# Patient Record
Sex: Male | Born: 1975 | Race: Black or African American | Hispanic: No | Marital: Married | Smoking: Never smoker
Health system: Southern US, Community
[De-identification: ages and names within clinical notes are randomized; demographics above are authoritative.]

---

## 2011-01-31 ENCOUNTER — Emergency Department (HOSPITAL_COMMUNITY): Payer: BC Managed Care – PPO

## 2011-01-31 ENCOUNTER — Emergency Department (HOSPITAL_COMMUNITY)
Admission: EM | Admit: 2011-01-31 | Discharge: 2011-01-31 | Disposition: A | Discharge status: Home / Self Care | Payer: BC Managed Care – PPO | Attending: Emergency Medicine | Admitting: Emergency Medicine

## 2011-01-31 DIAGNOSIS — R6884 Jaw pain: Secondary | ICD-10-CM | POA: Insufficient documentation

## 2011-01-31 DIAGNOSIS — K089 Disorder of teeth and supporting structures, unspecified: Secondary | ICD-10-CM | POA: Insufficient documentation

## 2011-01-31 DIAGNOSIS — K029 Dental caries, unspecified: Secondary | ICD-10-CM | POA: Insufficient documentation

## 2011-01-31 DIAGNOSIS — K047 Periapical abscess without sinus: Secondary | ICD-10-CM

## 2011-01-31 MED ORDER — AMOXICILLIN 250 MG PO CAPS: 250.0000 mg | ORAL_CAPSULE | Freq: Two times a day (BID) | ORAL | Status: AC

## 2011-01-31 MED ORDER — OXYCODONE-ACETAMINOPHEN 5-325 MG PO TABS
2.0000 | ORAL_TABLET | Freq: Once | ORAL | Status: AC
  Administered 2011-01-31: 2 via ORAL
  Filled 2011-01-31: qty 2

## 2011-01-31 MED ORDER — OXYCODONE-ACETAMINOPHEN 5-325 MG PO TABS: 1.0000 | ORAL_TABLET | Freq: Four times a day (QID) | ORAL | Status: AC | PRN

## 2011-01-31 NOTE — ED Notes (Signed)
Pt presents with NAD- seen by dentist today for presenting complaint and was referred here for evaluation

## 2011-01-31 NOTE — ED Notes (Signed)
Pt given discharge info and stated understanding amb indep to discharge window with spouse.

## 2011-01-31 NOTE — ED Provider Notes (Signed)
History     CSN: [id number] Arrival date & time: 01/31/2011 10:34 AM   First MD Initiated Contact with Patient 01/31/11 1217      Chief Complaint  Patient presents with  . Oral Swelling  . Dental Pain  . Facial Swelling    (Consider location/radiation/quality/duration/timing/severity/associated sxs/prior treatment) The history is provided by the patient.    Pt presents to the ED from his dentist for a dental abscess. He has been referred to an oral surgeon, however, the dentist wanted to be sure that the patient did not have periosteal involvement. Pt has been having pain and swelling for approx a week. He has multiple cavitates and the lesion in question is involved with a broken tooth. Pt has been referred to [facility]. He denies severe pain, fevers, chills, weakness, previous injury, previous abscesses, and headaches.  History reviewed. No pertinent past medical history.  History reviewed. No pertinent past surgical history.  History reviewed. No pertinent family history.  History  Substance Use Topics  . Smoking status: Never Smoker   . Smokeless tobacco: Not on file  . Alcohol Use: Yes      Review of Systems  All other systems reviewed and are negative.    Allergies  Review of patient's allergies indicates no known allergies.  Home Medications   Current Outpatient Rx  Name Route Sig Dispense Refill  . IBUPROFEN 200 MG PO TABS Oral Take 200 mg by mouth every 6 (six) hours as needed. pain       BP 139/83  Pulse 77  Temp(Src) 98.4 F (36.9 C) (Oral)  Resp 18  Wt 235 lb (106.595 kg)  SpO2 100%  Physical Exam  Nursing note and vitals reviewed. Constitutional: He is oriented to person, place, and time. He appears well-developed and well-nourished.  HENT:  Head: Normocephalic and atraumatic. No trismus in the jaw.  Mouth/Throat: He does not have dentures. No oral lesions. Normal dentition. Dental abscesses and dental caries present. No uvula  swelling or lacerations.    Eyes: EOM are normal. Pupils are equal, round, and reactive to light.  Neck: Normal range of motion.  Cardiovascular: Normal rate and regular rhythm.   Pulmonary/Chest: Effort normal and breath sounds normal.  Musculoskeletal: Normal range of motion.  Neurological: He is alert and oriented to person, place, and time.  Skin: Skin is warm and dry.    ED Course  Procedures (including critical care time)  Labs Reviewed - No data to display Ct Maxillofacial Wo Cm  01/31/2011  *RADIOLOGY REPORT*  Clinical Data: Left lower tooth and jaw pain over the past 3 days. Left sided facial swelling.  CT MAXILLOFACIAL WITHOUT CONTRAST 01/31/2011:  Technique:  Multidetector CT imaging of the maxillofacial structures was performed. Multiplanar CT image reconstructions were also generated.   A metallic BB was placed on the right frontotemporal region in order to reliably differentiate right from left.  Comparison: None.  Findings: Large dental caries involving the first left mandibular molar and the second and third right mandibular molars (teeth numbers 19, 31, 32) without evidence of significant periapical abscesses.  Localized soft tissue swelling overlying the left mandible; without intravenous access, it is difficult to determine whether this is a true abscess or whether it is a phlegmon.  No evidence of subperiosteal abscess on the bone window images.  Edema in the subcutaneous fat of the lower left face.  Orbits and globes intact. Paranasal sinuses, mastoid air cells, and middle ear cavities well-aerated.  Bony  nasal septal deviation to the right.  IMPRESSION:  1.  Large dental caries involving the left first mandibular molar and the right second and third mandibular molars (teeth numbers 19, 31, 32). 2.  Localized soft tissue swelling overlying the left mandible; without intravenous contrast, is difficult to know if this represents a true abscess or just phlegmonous change.  Original  Report Authenticated By: Arnell Sieving, M.D.     No diagnosis found.    MDM  Spoke with radiologist doctor Lyman Bishop regarding  CT scan. Scan with contrast preferred to differentiate between phlegmon and abscess. However, infection is localized with no signs of periosteal involvement.         Dorthula Matas, PA 01/31/11 1426

## 2011-01-31 NOTE — ED Provider Notes (Signed)
Medical screening examination/treatment/procedure(s) were performed by non-physician practitioner and as supervising physician I was immediately available for consultation/collaboration.    Nelia Shi, MD 01/31/11 [phone]

## 2013-09-27 ENCOUNTER — Emergency Department (HOSPITAL_COMMUNITY)
Admission: EM | Admit: 2013-09-27 | Discharge: 2013-09-27 | Disposition: A | Discharge status: Home / Self Care | Payer: BC Managed Care – PPO | Attending: Emergency Medicine | Admitting: Emergency Medicine

## 2013-09-27 ENCOUNTER — Emergency Department (HOSPITAL_COMMUNITY): Payer: BC Managed Care – PPO

## 2013-09-27 ENCOUNTER — Encounter (HOSPITAL_COMMUNITY): Payer: Self-pay | Admitting: Emergency Medicine

## 2013-09-27 DIAGNOSIS — Y92410 Unspecified street and highway as the place of occurrence of the external cause: Secondary | ICD-10-CM | POA: Insufficient documentation

## 2013-09-27 DIAGNOSIS — S40012A Contusion of left shoulder, initial encounter: Secondary | ICD-10-CM

## 2013-09-27 DIAGNOSIS — Z23 Encounter for immunization: Secondary | ICD-10-CM | POA: Insufficient documentation

## 2013-09-27 DIAGNOSIS — Y9355 Activity, bike riding: Secondary | ICD-10-CM | POA: Insufficient documentation

## 2013-09-27 DIAGNOSIS — S40019A Contusion of unspecified shoulder, initial encounter: Secondary | ICD-10-CM | POA: Insufficient documentation

## 2013-09-27 DIAGNOSIS — IMO0002 Reserved for concepts with insufficient information to code with codable children: Secondary | ICD-10-CM | POA: Insufficient documentation

## 2013-09-27 LAB — COMPREHENSIVE METABOLIC PANEL
ALBUMIN: 4.4 g/dL (ref 3.5–5.2)
ALK PHOS: 79 U/L (ref 39–117)
ALT: 40 U/L (ref 0–53)
AST: 54 U/L — AB (ref 0–37)
Anion gap: 15 (ref 5–15)
BILIRUBIN TOTAL: 0.3 mg/dL (ref 0.3–1.2)
BUN: 21 mg/dL (ref 6–23)
CHLORIDE: 108 meq/L (ref 96–112)
CO2: 23 mEq/L (ref 19–32)
Calcium: 9.1 mg/dL (ref 8.4–10.5)
Creatinine, Ser: 0.94 mg/dL (ref 0.50–1.35)
GFR calc Af Amer: >90 mL/min (ref >90)
GFR calc non Af Amer: >90 mL/min (ref >90)
Glucose, Bld: 149 mg/dL — ABNORMAL HIGH (ref 70–99)
POTASSIUM: 3.5 meq/L — AB (ref 3.7–5.3)
SODIUM: 146 meq/L (ref 137–147)
TOTAL PROTEIN: 7.8 g/dL (ref 6.0–8.3)

## 2013-09-27 LAB — CBC WITH DIFFERENTIAL/PLATELET
BASOS ABS: 0 10*3/uL (ref 0.0–0.1)
BASOS PCT: 0 % (ref 0–1)
EOS ABS: 0 10*3/uL (ref 0.0–0.7)
Eosinophils Relative: 0 % (ref 0–5)
HCT: 38.7 % — ABNORMAL LOW (ref 39.0–52.0)
HEMOGLOBIN: 13.9 g/dL (ref 13.0–17.0)
Lymphocytes Relative: 33 % (ref 12–46)
Lymphs Abs: 3.3 10*3/uL (ref 0.7–4.0)
MCH: 31 pg (ref 26.0–34.0)
MCHC: 35.9 g/dL (ref 30.0–36.0)
MCV: 86.4 fL (ref 78.0–100.0)
MONOS PCT: 7 % (ref 3–12)
Monocytes Absolute: 0.7 10*3/uL (ref 0.1–1.0)
NEUTROS ABS: 6.1 10*3/uL (ref 1.7–7.7)
NEUTROS PCT: 60 % (ref 43–77)
PLATELETS: 224 10*3/uL (ref 150–400)
RBC: 4.48 MIL/uL (ref 4.22–5.81)
RDW: 13.2 % (ref 11.5–15.5)
WBC: 10.1 10*3/uL (ref 4.0–10.5)

## 2013-09-27 LAB — ETHANOL: Alcohol, Ethyl (B): 315 mg/dL — ABNORMAL HIGH (ref 0–11)

## 2013-09-27 MED ORDER — TETANUS-DIPHTH-ACELL PERTUSSIS 5-2.5-18.5 LF-MCG/0.5 IM SUSP
0.5000 mL | Freq: Once | INTRAMUSCULAR | Status: AC
Start: 2013-09-27 — End: 2013-09-27
  Administered 2013-09-27: 0.5 mL via INTRAMUSCULAR
  Filled 2013-09-27: qty 0.5

## 2013-09-27 MED ORDER — IBUPROFEN 800 MG PO TABS: 800.0000 mg | ORAL_TABLET | Freq: Three times a day (TID) | ORAL | Status: DC
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
## 2013-09-27 NOTE — Discharge Instructions (Signed)
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
## 2013-09-27 NOTE — ED Notes (Signed)
Patient transported to CT and xray 

## 2013-09-27 NOTE — ED Notes (Signed)
PT reports L shoulder and L scapular pain. PT also has mild abrasions on L calf, R knee and thigh, L and R metacarpals and R Forearm
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
## 2013-09-27 NOTE — ED Notes (Signed)
Patient transported to X-ray 

## 2020-08-11 ENCOUNTER — Emergency Department (HOSPITAL_COMMUNITY): Payer: BC Managed Care – PPO

## 2020-08-11 ENCOUNTER — Other Ambulatory Visit: Payer: Self-pay

## 2020-08-11 ENCOUNTER — Emergency Department (HOSPITAL_COMMUNITY)
Admission: EM | Admit: 2020-08-11 | Discharge: 2020-08-12 | Disposition: A | Discharge status: Home / Self Care | Payer: BC Managed Care – PPO | Attending: Emergency Medicine | Admitting: Emergency Medicine

## 2020-08-11 DIAGNOSIS — T07XXXA Unspecified multiple injuries, initial encounter: Secondary | ICD-10-CM

## 2020-08-11 DIAGNOSIS — S8012XA Contusion of left lower leg, initial encounter: Secondary | ICD-10-CM | POA: Diagnosis not present

## 2020-08-11 DIAGNOSIS — S5012XA Contusion of left forearm, initial encounter: Secondary | ICD-10-CM | POA: Diagnosis not present

## 2020-08-11 DIAGNOSIS — X58XXXA Exposure to other specified factors, initial encounter: Secondary | ICD-10-CM | POA: Diagnosis not present

## 2020-08-11 DIAGNOSIS — S59912A Unspecified injury of left forearm, initial encounter: Secondary | ICD-10-CM | POA: Diagnosis present

## 2020-08-11 MED ORDER — BACITRACIN ZINC 500 UNIT/GM EX OINT
TOPICAL_OINTMENT | Freq: Once | CUTANEOUS | Status: AC
  Filled 2020-08-11: qty 3.6
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
## 2020-08-11 NOTE — Discharge Instructions (Addendum)
Local wound care with bacitracin and dressing changes twice daily.  Ice for 20 minutes every 2 hours while awake for the next 2 days.  Rest.  Take ibuprofen 600 mg every 6 hours as needed for pain.  Return to the emergency department for any new and/or concerning symptoms.

## 2020-08-12 NOTE — ED Notes (Signed)
Left leg and left arm wounds cleansed and dressed. Pt educated on how to clean and dress wounds at home. Provided pt with wound cleanser, bandages, and bacitracin for home use. All questions/concerned answered.
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
## 2023-01-19 ENCOUNTER — Other Ambulatory Visit: Payer: Self-pay

## 2023-01-19 ENCOUNTER — Encounter (HOSPITAL_COMMUNITY): Payer: Self-pay

## 2023-01-19 ENCOUNTER — Emergency Department (HOSPITAL_BASED_OUTPATIENT_CLINIC_OR_DEPARTMENT_OTHER): Payer: BC Managed Care – PPO

## 2023-01-19 ENCOUNTER — Emergency Department (HOSPITAL_COMMUNITY): Payer: BC Managed Care – PPO

## 2023-01-19 ENCOUNTER — Telehealth: Payer: Self-pay | Admitting: Nurse Practitioner

## 2023-01-19 ENCOUNTER — Emergency Department (HOSPITAL_COMMUNITY)
Admission: EM | Admit: 2023-01-19 | Discharge: 2023-01-19 | Disposition: A | Discharge status: Home / Self Care | Payer: BC Managed Care – PPO | Attending: Emergency Medicine | Admitting: Emergency Medicine

## 2023-01-19 DIAGNOSIS — M7989 Other specified soft tissue disorders: Secondary | ICD-10-CM | POA: Insufficient documentation

## 2023-01-19 DIAGNOSIS — M25561 Pain in right knee: Secondary | ICD-10-CM | POA: Diagnosis present

## 2023-01-19 MED ORDER — HYDROCODONE-ACETAMINOPHEN 5-325 MG PO TABS
1.0000 | ORAL_TABLET | Freq: Once | ORAL | Status: AC
  Administered 2023-01-19: 1 via ORAL
  Filled 2023-01-19: qty 1

## 2023-01-19 MED ORDER — NAPROXEN 500 MG PO TABS: 500.0000 mg | ORAL_TABLET | Freq: Two times a day (BID) | ORAL | 0 refills | Status: DC

## 2023-01-19 NOTE — ED Triage Notes (Signed)
C/o right knee pain with swelling x 3weeks  Denies fall/trauma Ibuprofen, tylenol, ice w/o relief

## 2023-01-19 NOTE — Telephone Encounter (Signed)
Pt is calling to schedule NPA with [facility]. No available NPA showing on the agents side. Please advise with Patient [phone]

## 2023-01-19 NOTE — Discharge Instructions (Addendum)
You were seen in the emergency room today for knee pain.  Ultrasound shows no deep vein thrombosis or blood clot.  Imaging shows a small joint effusion without fracture.  I would recommend following up with primary care.  Please take Tylenol and naproxen regularly.  Rest ice and elevate.  Please wear knee brace as discussed for support.  You can also use Ace bandage to help with swelling.  Please return to emergency room if you have any new or worsening symptoms.

## 2023-01-19 NOTE — ED Provider Notes (Signed)
[facility] AT [facility] Provider Note   CSN: [id number] Arrival date & time: 01/19/23  1527     History {Add pertinent medical, surgical, social history, OB history to HPI:1} Chief Complaint  Patient presents with   Knee Pain    Shawn Huber is a 47 y.o. male without significant past medical history reporting to emergency room with 3 weeks of right knee pain and swelling.  Patient reports he is on his feet for extended period of times at work and thinks he possibly tweaked it while working.  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  Patient's been taking Tylenol at home.  Although mentioned in triage note has not been taking ibuprofen.  Denies any traumas or falls.  Patient reports he is able to ambulate but has discomfort.  Denies any fever, IV drug use, or other associated symptoms.   Knee Pain      Home Medications Prior to Admission medications   Medication Sig Start Date End Date Taking? Authorizing Provider  naproxen (NAPROSYN) 500 MG tablet Take 1 tablet (500 mg total) by mouth 2 (two) times daily. 01/19/23  Yes Gamble Enderle, Horald Chestnut, PA-C  ibuprofen (ADVIL,MOTRIN) 800 MG tablet Take 1 tablet (800 mg total) by mouth 3 (three) times daily. Patient not taking: No sig reported 09/27/13   Glynn Octave, MD      Allergies    Patient has no known allergies.    Review of Systems   Review of Systems  Musculoskeletal:  Positive for joint swelling.    Physical Exam Updated Vital Signs BP (!) 150/87 (BP Location: Left Arm)   Pulse 89   Temp 99.6 F (37.6 C) (Oral)   Resp 18   Wt 111 kg   SpO2 97%   BMI 30.59 kg/m  Physical Exam Vitals and nursing note reviewed.  Constitutional:      General: He is not in acute distress.    Appearance: He is not toxic-appearing.  HENT:     Head: Normocephalic and atraumatic.  Eyes:     General: No scleral icterus.    Conjunctiva/sclera: Conjunctivae normal.   Cardiovascular:     Rate and Rhythm: Normal rate and regular rhythm.     Pulses: Normal pulses.     Heart sounds: Normal heart sounds.  Pulmonary:     Effort: Pulmonary effort is normal. No respiratory distress.     Breath sounds: Normal breath sounds.  Abdominal:     General: Abdomen is flat. Bowel sounds are normal.     Palpations: Abdomen is soft.     Tenderness: There is no abdominal tenderness.  Musculoskeletal:     Comments: Patient has right sided lower extremity edema, mild and nonpitting.  Neurovascularly intact.  Patient has tenderness to palpation over calf and posterior aspect of knee.  Patient moving extremity with discomfort.  No obvious erythema over joint or open wounds.  Skin:    General: Skin is warm and dry.     Findings: No lesion.  Neurological:     General: No focal deficit present.     Mental Status: He is alert and oriented to person, place, and time. Mental status is at baseline.     ED Results / Procedures / Treatments   Labs (all labs ordered are listed, but only abnormal results are displayed) Labs Reviewed - No data to display  EKG None  Radiology DG Knee 2 Views Right  Result Date: 01/19/2023 CLINICAL DATA:  Right knee pain EXAM: RIGHT KNEE - 1-2 VIEW COMPARISON:  None Available. FINDINGS: Normal alignment. No acute fracture or dislocation. Medial and lateral compartment joint spaces are preserved. Moderate right knee effusion. Mild degenerative enthesopathy involving the insertion of the quadriceps tendon upon the patella. Soft tissues are otherwise unremarkable. IMPRESSION: 1. Moderate right knee effusion.  No fracture or dislocation. Electronically Signed   By: Helyn Numbers M.D.   On: 01/19/2023 20:45   VAS [place] LOWER EXTREMITY VENOUS (DVT) (7a-7p)  Result Date: 01/19/2023  Lower Venous DVT Study Patient Name:  Shawn Huber  Date of Exam:   01/19/2023 Medical Rec #: [id number]   Accession #:    [id number] Date of Birth: Nov 13, 1975   Patient  Gender: M Patient Age:   56 years Exam Location:  [facility] Procedure:      VAS [place] LOWER EXTREMITY VENOUS (DVT) Referring Phys: Asher Muir Kelleigh Skerritt --------------------------------------------------------------------------------  Indications: Swelling, and Edema.  Risk Factors: None identified. Comparison Study: No prior study Performing Technologist: Shona Simpson  Examination Guidelines: A complete evaluation includes B-mode imaging, spectral Doppler, color Doppler, and power Doppler as needed of all accessible portions of each vessel. Bilateral testing is considered an integral part of a complete examination. Limited examinations for reoccurring indications may be performed as noted. The reflux portion of the exam is performed with the patient in reverse Trendelenburg.  +---------+---------------+---------+-----------+----------+--------------+ RIGHT    CompressibilityPhasicitySpontaneityPropertiesThrombus Aging +---------+---------------+---------+-----------+----------+--------------+ CFV      Full           Yes      Yes                                 +---------+---------------+---------+-----------+----------+--------------+ SFJ      Full                                                        +---------+---------------+---------+-----------+----------+--------------+ FV Prox  Full                                                        +---------+---------------+---------+-----------+----------+--------------+ FV Mid   Full                                                        +---------+---------------+---------+-----------+----------+--------------+ FV DistalFull                                                        +---------+---------------+---------+-----------+----------+--------------+ PFV      Full                                                        +---------+---------------+---------+-----------+----------+--------------+  POP      Full            Yes      Yes                                 +---------+---------------+---------+-----------+----------+--------------+ PTV      Full                                                        +---------+---------------+---------+-----------+----------+--------------+ PERO     Full                                                        +---------+---------------+---------+-----------+----------+--------------+   +----+---------------+---------+-----------+----------+--------------+ LEFTCompressibilityPhasicitySpontaneityPropertiesThrombus Aging +----+---------------+---------+-----------+----------+--------------+ CFV Full           Yes      Yes                                 +----+---------------+---------+-----------+----------+--------------+     Summary: RIGHT: - There is no evidence of deep vein thrombosis in the lower extremity.  - No cystic structure found in the popliteal fossa.  LEFT: - No evidence of common femoral vein obstruction.   *See table(s) above for measurements and observations.    Preliminary     Procedures Procedures  {Document cardiac monitor, telemetry assessment procedure when appropriate:1}  Medications Ordered in ED Medications  HYDROcodone-acetaminophen (NORCO/VICODIN) 5-325 MG per tablet 1 tablet (1 tablet Oral Given 01/19/23 1644)    ED Course/ Medical Decision Making/ A&P   {   Click here for ABCD2, HEART and other calculatorsREFRESH Note before signing :1}                              Medical Decision Making Amount and/or Complexity of Data Reviewed Radiology: ordered.  Risk Prescription drug management.   Aurelio Brash 47 y.o. presented today for right knee pain Working Ddx: dependent edema, venous insufficiency, thrombophlebitis, secondary to medications, CHF, edema, AKI, nephrotic syndrome, dvt   R/o DDx:: These are considered less likely than current impression due to history of present illness, physical exam, labs/imaging  findings.  Review of prior external notes: none   Unique Tests and My Interpretation:   Vascular US studies with no evidence of DVT X-ray with joint effusion without obvious area of fracture  Problem List / ED Course / Critical interventions / Medication management  *** Patient hemodynamically stable throughout stay.  Overall well-appearing.  Symptoms improved with pain medication.  Ordered knee brace, discussed rest, ice, elevation, anti-inflammatorys.  I ordered medication including norco  for pain  Reevaluation of the patient after these medicines showed that the patient improved Patients vitals assessed. Upon arrival patient is hemodynamically stable.  I have reviewed the patients home medicines and have made adjustments as needed  Consult: None     Plan:  F/u w/ PCP and Ortho. Patient was given return precautions. Patient stable for discharge at this time.  Patient educated on sx/dx and verbalized understanding  of plan. Return to ER w/ new or worsening sx.    {Document critical care time when appropriate:1} {Document review of labs and clinical decision tools ie heart score, Chads2Vasc2 etc:1}  {Document your independent review of radiology images, and any outside records:1} {Document your discussion with family members, caretakers, and with consultants:1} {Document social determinants of health affecting pt's care:1} {Document your decision making why or why not admission, treatments were needed:1} Final Clinical Impression(s) / ED Diagnoses Final diagnoses:  Acute pain of right knee    Rx / DC Orders ED Discharge Orders          Ordered    naproxen (NAPROSYN) 500 MG tablet  2 times daily        01/19/23 2122

## 2023-01-19 NOTE — Progress Notes (Signed)
Lower extremity venous duplex completed. Please see CV Procedures for preliminary results.  Shona Simpson, RVT 01/19/23 6:19 PM

## 2023-01-19 NOTE — ED Notes (Signed)
[place] completed

## 2023-01-23 ENCOUNTER — Telehealth (INDEPENDENT_AMBULATORY_CARE_PROVIDER_SITE_OTHER): Payer: Self-pay | Admitting: Primary Care

## 2023-01-23 NOTE — Telephone Encounter (Signed)
Called pt to see if they wanted to schedule an apt. Left VM.

## 2023-01-23 NOTE — Telephone Encounter (Signed)
Called patient to schedule appointment left voicemail to call office back

## 2023-10-31 ENCOUNTER — Telehealth: Payer: Self-pay | Admitting: Nurse Practitioner

## 2023-10-31 ENCOUNTER — Other Ambulatory Visit: Payer: Self-pay | Admitting: Nurse Practitioner

## 2023-10-31 DIAGNOSIS — Z1211 Encounter for screening for malignant neoplasm of colon: Secondary | ICD-10-CM

## 2023-10-31 NOTE — Telephone Encounter (Signed)
 Pt confirmed apt (per vr) 9/3

## 2023-11-02 ENCOUNTER — Other Ambulatory Visit: Payer: Self-pay

## 2023-11-02 ENCOUNTER — Ambulatory Visit: Attending: Nurse Practitioner | Admitting: Nurse Practitioner

## 2023-11-02 ENCOUNTER — Encounter: Payer: Self-pay | Admitting: Nurse Practitioner

## 2023-11-02 VITALS — BP 136/86 | HR 72 | Resp 19 | Ht 75.0 in | Wt 222.0 lb

## 2023-11-02 DIAGNOSIS — M2550 Pain in unspecified joint: Secondary | ICD-10-CM | POA: Diagnosis not present

## 2023-11-02 DIAGNOSIS — Z23 Encounter for immunization: Secondary | ICD-10-CM | POA: Diagnosis not present

## 2023-11-02 DIAGNOSIS — F1020 Alcohol dependence, uncomplicated: Secondary | ICD-10-CM

## 2023-11-02 DIAGNOSIS — Z7689 Persons encountering health services in other specified circumstances: Secondary | ICD-10-CM | POA: Diagnosis not present

## 2023-11-02 DIAGNOSIS — Z1211 Encounter for screening for malignant neoplasm of colon: Secondary | ICD-10-CM

## 2023-11-02 MED ORDER — IBUPROFEN 800 MG PO TABS
800.0000 mg | ORAL_TABLET | Freq: Three times a day (TID) | ORAL | 1 refills | Status: AC
  Filled 2023-11-02: qty 60, 20d supply, fill #0

## 2023-11-02 NOTE — Progress Notes (Signed)
 Assessment & Plan:  Shawn Huber was seen today for new patient (initial visit).  Diagnoses and all orders for this visit:  ETOHism (HCC) -     CBC with Differential/Platelet -     CMP14+EGFR -     Lipid panel  Need for influenza vaccination -     Flu vaccine trivalent PF, 6mos and older(Flulaval,Afluria,Fluarix,Fluzone)  Colon cancer screening -     Ambulatory referral to Gastroenterology  Encounter to establish care  Arthralgia of multiple joints -     ibuprofen  (ADVIL ) 800 MG tablet; Take 1 tablet (800 mg total) by mouth 3 (three) times daily.    Patient has been counseled on age-appropriate routine health concerns for screening and prevention. These are reviewed and up-to-date. Referrals have been placed accordingly. Immunizations are up-to-date or declined.    Subjective:   Chief Complaint  Patient presents with   New Patient (Initial Visit)    Shawn Huber 47 y.o. male presents to office today to establish care. Shawn Huber is accompanied by his spouse who is also a patient of mine.    Shawn Huber has no significant past medical history.  Shawn Huber is overdue for colonoscopy and we will place a referral to GI today  Shawn Huber consumes a moderate amount of alcohol every weekend.  Drinks 4-5 beers on Friday and Saturday and a lesser amount of beers on Sunday.    Blood pressure is well-controlled BP Readings from Last 3 Encounters:  11/02/23 136/86  01/19/23 (!) 136/95  08/12/20 131/89     Review of Systems  Constitutional:  Negative for fever, malaise/fatigue and weight loss.  HENT: Negative.  Negative for nosebleeds.   Eyes: Negative.  Negative for blurred vision, double vision and photophobia.  Respiratory: Negative.  Negative for cough and shortness of breath.   Cardiovascular: Negative.  Negative for chest pain, palpitations and leg swelling.  Gastrointestinal: Negative.  Negative for heartburn, nausea and vomiting.  Musculoskeletal: Negative.  Negative for myalgias.  Neurological: Negative.   Negative for dizziness, focal weakness, seizures and headaches.  Psychiatric/Behavioral: Negative.  Negative for suicidal ideas.     No past medical history on file.  No past surgical history on file.  No family history on file.  Social History Reviewed with no changes to be made today.   Outpatient Medications Prior to Visit  Medication Sig Dispense Refill   ibuprofen  (ADVIL ,MOTRIN ) 800 MG tablet Take 1 tablet (800 mg total) by mouth 3 (three) times daily. (Patient not taking: Reported on 11/02/2023) 21 tablet 0   naproxen  (NAPROSYN ) 500 MG tablet Take 1 tablet (500 mg total) by mouth 2 (two) times daily. (Patient not taking: Reported on 11/02/2023) 60 tablet 0   No facility-administered medications prior to visit.    No Known Allergies     Objective:    BP 136/86 (BP Location: Left Arm, Patient Position: Sitting, Cuff Size: Normal)   Pulse 72   Resp 19   Ht 6' 3 (1.905 m)   Wt 222 lb (100.7 kg)   SpO2 100%   BMI 27.75 kg/m  Wt Readings from Last 3 Encounters:  11/02/23 222 lb (100.7 kg)  01/19/23 244 lb 11.4 oz (111 kg)  08/11/20 245 lb (111.1 kg)    Physical Exam Vitals and nursing note reviewed.  Constitutional:      Appearance: Shawn Huber is well-developed.  HENT:     Head: Normocephalic and atraumatic.  Cardiovascular:     Rate and Rhythm: Normal rate and regular rhythm.  Heart sounds: Normal heart sounds. No murmur heard.    No friction rub. No gallop.  Pulmonary:     Effort: Pulmonary effort is normal. No tachypnea or respiratory distress.     Breath sounds: Normal breath sounds. No decreased breath sounds, wheezing, rhonchi or rales.  Chest:     Chest wall: No tenderness.  Abdominal:     General: Bowel sounds are normal.     Palpations: Abdomen is soft.  Musculoskeletal:        General: Normal range of motion.     Cervical back: Normal range of motion.  Skin:    General: Skin is warm and dry.  Neurological:     Mental Status: Shawn Huber is alert and oriented  to person, place, and time.     Coordination: Coordination normal.  Psychiatric:        Behavior: Behavior normal. Behavior is cooperative.        Thought Content: Thought content normal.        Judgment: Judgment normal.          Patient has been counseled extensively about nutrition and exercise as well as the importance of adherence with medications and regular follow-up. The patient was given clear instructions to go to ER or return to medical center if symptoms don't improve, worsen or new problems develop. The patient verbalized understanding.   Follow-up: Return for physical.   Haze LELON Servant, FNP-BC [facility] [place], [place] [phone]   11/02/2023, 9:37 AM

## 2023-11-02 NOTE — Patient Instructions (Signed)
 [facility] Located in: [place] [facility] [address] Address: [address], [place], [place] [address] Phone: [phone]

## 2023-11-03 LAB — CMP14+EGFR
ALT: 30 IU/L (ref 0–44)
AST: 33 IU/L (ref 0–40)
Albumin: 4.6 g/dL (ref 4.1–5.1)
Alkaline Phosphatase: 76 IU/L (ref 44–121)
BUN/Creatinine Ratio: 22 — ABNORMAL HIGH (ref 9–20)
BUN: 20 mg/dL (ref 6–24)
Bilirubin Total: 0.4 mg/dL (ref 0.0–1.2)
CO2: 21 mmol/L (ref 20–29)
Calcium: 9.6 mg/dL (ref 8.7–10.2)
Chloride: 101 mmol/L (ref 96–106)
Creatinine, Ser: 0.92 mg/dL (ref 0.76–1.27)
Globulin, Total: 2.9 g/dL (ref 1.5–4.5)
Glucose: 111 mg/dL — ABNORMAL HIGH (ref 70–99)
Potassium: 4.6 mmol/L (ref 3.5–5.2)
Sodium: 137 mmol/L (ref 134–144)
Total Protein: 7.5 g/dL (ref 6.0–8.5)
eGFR: 104 mL/min/1.73 (ref >59)

## 2023-11-03 LAB — LIPID PANEL
Chol/HDL Ratio: 3.9 ratio (ref 0.0–5.0)
Cholesterol, Total: 176 mg/dL (ref 100–199)
HDL: 45 mg/dL (ref >39)
LDL Chol Calc (NIH): 105 mg/dL — ABNORMAL HIGH (ref 0–99)
Triglycerides: 145 mg/dL (ref 0–149)
VLDL Cholesterol Cal: 26 mg/dL (ref 5–40)

## 2023-11-03 LAB — CBC WITH DIFFERENTIAL/PLATELET
Basophils Absolute: 0 x10E3/uL (ref 0.0–0.2)
Basos: 0 %
EOS (ABSOLUTE): 0.1 x10E3/uL (ref 0.0–0.4)
Eos: 1 %
Hematocrit: 41.8 % (ref 37.5–51.0)
Hemoglobin: 13.9 g/dL (ref 13.0–17.7)
Immature Grans (Abs): 0.1 x10E3/uL (ref 0.0–0.1)
Immature Granulocytes: 1 %
Lymphocytes Absolute: 2.3 x10E3/uL (ref 0.7–3.1)
Lymphs: 26 %
MCH: 30.5 pg (ref 26.6–33.0)
MCHC: 33.3 g/dL (ref 31.5–35.7)
MCV: 92 fL (ref 79–97)
Monocytes Absolute: 0.7 x10E3/uL (ref 0.1–0.9)
Monocytes: 7 %
Neutrophils Absolute: 5.8 x10E3/uL (ref 1.4–7.0)
Neutrophils: 65 %
Platelets: 221 x10E3/uL (ref 150–450)
RBC: 4.56 x10E6/uL (ref 4.14–5.80)
RDW: 13.2 % (ref 11.6–15.4)
WBC: 8.9 x10E3/uL (ref 3.4–10.8)

## 2023-11-04 ENCOUNTER — Ambulatory Visit: Payer: Self-pay | Admitting: Nurse Practitioner
